# Patient Record
Sex: Male | Born: 1960 | Race: White | Hispanic: No | Smoking: Current every day smoker
Health system: Southern US, Community
[De-identification: ages and names within clinical notes are randomized; demographics above are authoritative.]

## PROBLEM LIST (undated history)

## (undated) DIAGNOSIS — L309 Dermatitis, unspecified: Secondary | ICD-10-CM

## (undated) DIAGNOSIS — F329 Major depressive disorder, single episode, unspecified: Secondary | ICD-10-CM

## (undated) DIAGNOSIS — I10 Essential (primary) hypertension: Secondary | ICD-10-CM

## (undated) DIAGNOSIS — M810 Age-related osteoporosis without current pathological fracture: Secondary | ICD-10-CM

## (undated) DIAGNOSIS — F419 Anxiety disorder, unspecified: Secondary | ICD-10-CM

## (undated) DIAGNOSIS — F32A Depression, unspecified: Secondary | ICD-10-CM

## (undated) DIAGNOSIS — K219 Gastro-esophageal reflux disease without esophagitis: Secondary | ICD-10-CM

## (undated) DIAGNOSIS — F319 Bipolar disorder, unspecified: Secondary | ICD-10-CM

## (undated) DIAGNOSIS — M1990 Unspecified osteoarthritis, unspecified site: Secondary | ICD-10-CM

## (undated) DIAGNOSIS — M2550 Pain in unspecified joint: Secondary | ICD-10-CM

## (undated) DIAGNOSIS — R51 Headache: Secondary | ICD-10-CM

## (undated) HISTORY — PX: TONSILLECTOMY: SUR1361

## (undated) HISTORY — PX: SEPTOPLASTY: SUR1290

## (undated) HISTORY — PX: OTHER SURGICAL HISTORY: SHX169

---

## 2001-07-16 ENCOUNTER — Encounter: Payer: Self-pay | Admitting: Emergency Medicine

## 2001-07-16 ENCOUNTER — Inpatient Hospital Stay (HOSPITAL_COMMUNITY): Admission: EM | Admit: 2001-07-16 | Discharge: 2001-07-17 | Payer: Self-pay

## 2001-12-16 ENCOUNTER — Emergency Department (HOSPITAL_COMMUNITY): Admission: EM | Admit: 2001-12-16 | Discharge: 2001-12-16 | Payer: Self-pay | Admitting: Emergency Medicine

## 2002-01-06 ENCOUNTER — Ambulatory Visit (HOSPITAL_COMMUNITY): Admission: RE | Admit: 2002-01-06 | Discharge: 2002-01-06 | Payer: Self-pay | Admitting: Family Medicine

## 2002-01-06 ENCOUNTER — Encounter: Payer: Self-pay | Admitting: Family Medicine

## 2002-04-05 ENCOUNTER — Encounter: Payer: Self-pay | Admitting: *Deleted

## 2002-04-05 ENCOUNTER — Encounter: Admission: RE | Admit: 2002-04-05 | Discharge: 2002-04-05 | Payer: Self-pay | Admitting: *Deleted

## 2002-05-22 ENCOUNTER — Encounter: Admission: RE | Admit: 2002-05-22 | Discharge: 2002-08-20 | Payer: Self-pay | Admitting: Family Medicine

## 2003-09-04 ENCOUNTER — Inpatient Hospital Stay (HOSPITAL_COMMUNITY): Admission: EM | Admit: 2003-09-04 | Discharge: 2003-09-06 | Payer: Self-pay | Admitting: Emergency Medicine

## 2004-03-24 ENCOUNTER — Ambulatory Visit: Payer: Self-pay | Admitting: Family Medicine

## 2004-03-31 ENCOUNTER — Ambulatory Visit (HOSPITAL_COMMUNITY): Admission: RE | Admit: 2004-03-31 | Discharge: 2004-03-31 | Payer: Self-pay | Admitting: Family Medicine

## 2004-04-11 ENCOUNTER — Ambulatory Visit: Payer: Self-pay | Admitting: Nurse Practitioner

## 2004-06-23 ENCOUNTER — Ambulatory Visit: Payer: Self-pay | Admitting: Family Medicine

## 2004-06-30 ENCOUNTER — Ambulatory Visit: Payer: Self-pay | Admitting: Family Medicine

## 2005-01-27 ENCOUNTER — Ambulatory Visit: Payer: Self-pay | Admitting: Family Medicine

## 2005-03-09 ENCOUNTER — Ambulatory Visit: Payer: Self-pay | Admitting: Family Medicine

## 2005-05-29 ENCOUNTER — Ambulatory Visit: Payer: Self-pay | Admitting: Family Medicine

## 2005-09-29 ENCOUNTER — Ambulatory Visit: Payer: Self-pay | Admitting: Family Medicine

## 2005-12-31 ENCOUNTER — Ambulatory Visit: Payer: Self-pay | Admitting: Internal Medicine

## 2006-02-17 ENCOUNTER — Ambulatory Visit: Payer: Self-pay | Admitting: Family Medicine

## 2006-02-25 ENCOUNTER — Ambulatory Visit: Payer: Self-pay | Admitting: Family Medicine

## 2006-03-01 ENCOUNTER — Ambulatory Visit (HOSPITAL_COMMUNITY): Admission: RE | Admit: 2006-03-01 | Discharge: 2006-03-01 | Payer: Self-pay | Admitting: Family Medicine

## 2006-04-05 ENCOUNTER — Ambulatory Visit: Payer: Self-pay | Admitting: Family Medicine

## 2006-04-07 ENCOUNTER — Ambulatory Visit: Payer: Self-pay | Admitting: Family Medicine

## 2006-04-12 ENCOUNTER — Ambulatory Visit (HOSPITAL_BASED_OUTPATIENT_CLINIC_OR_DEPARTMENT_OTHER): Admission: RE | Admit: 2006-04-12 | Discharge: 2006-04-12 | Payer: Self-pay | Admitting: Otolaryngology

## 2006-07-22 ENCOUNTER — Ambulatory Visit: Payer: Self-pay | Admitting: Family Medicine

## 2007-04-25 ENCOUNTER — Ambulatory Visit: Payer: Self-pay | Admitting: Family Medicine

## 2007-04-25 LAB — CONVERTED CEMR LAB
ALT: 20 units/L (ref 0–53)
AST: 18 units/L (ref 0–37)
Alkaline Phosphatase: 73 units/L (ref 39–117)
Calcium: 9.5 mg/dL (ref 8.4–10.5)
Chloride: 101 meq/L (ref 96–112)
Creatinine, Ser: 0.9 mg/dL (ref 0.40–1.50)
LDL Cholesterol: 94 mg/dL (ref 0–99)
Potassium: 4.4 meq/L (ref 3.5–5.3)
Total CHOL/HDL Ratio: 2.5
Valproic Acid Lvl: 22.3 ug/mL — ABNORMAL LOW (ref 50.0–100.0)

## 2007-10-24 ENCOUNTER — Ambulatory Visit: Payer: Self-pay | Admitting: Family Medicine

## 2007-10-24 LAB — CONVERTED CEMR LAB
Cholesterol: 215 mg/dL — ABNORMAL HIGH (ref 0–200)
PSA: 0.67 ng/mL (ref 0.10–4.00)
Testosterone: 256.13 ng/dL — ABNORMAL LOW (ref 350–890)
Total CHOL/HDL Ratio: 4.3

## 2007-11-15 ENCOUNTER — Ambulatory Visit: Payer: Self-pay | Admitting: Internal Medicine

## 2007-11-22 ENCOUNTER — Ambulatory Visit: Payer: Self-pay | Admitting: Internal Medicine

## 2007-12-22 ENCOUNTER — Ambulatory Visit: Payer: Self-pay | Admitting: Internal Medicine

## 2008-01-12 ENCOUNTER — Ambulatory Visit: Payer: Self-pay | Admitting: Internal Medicine

## 2008-01-13 ENCOUNTER — Ambulatory Visit (HOSPITAL_COMMUNITY): Admission: RE | Admit: 2008-01-13 | Discharge: 2008-01-13 | Payer: Self-pay | Admitting: Internal Medicine

## 2008-01-16 ENCOUNTER — Ambulatory Visit: Payer: Self-pay | Admitting: Internal Medicine

## 2008-01-27 ENCOUNTER — Ambulatory Visit: Payer: Self-pay | Admitting: Internal Medicine

## 2008-03-13 ENCOUNTER — Ambulatory Visit: Payer: Self-pay | Admitting: Family Medicine

## 2008-03-13 LAB — CONVERTED CEMR LAB
CO2: 23 meq/L (ref 19–32)
Calcium: 9.4 mg/dL (ref 8.4–10.5)
Cholesterol: 309 mg/dL — ABNORMAL HIGH (ref 0–200)
Creatinine, Ser: 1.01 mg/dL (ref 0.40–1.50)
HDL: 80 mg/dL (ref >39)
Sed Rate: 2 mm/hr (ref 0–16)
Sodium: 137 meq/L (ref 135–145)
Total CHOL/HDL Ratio: 3.9
Triglycerides: 295 mg/dL — ABNORMAL HIGH (ref <150)
Vit D, 1,25-Dihydroxy: 38 (ref 30–89)

## 2008-03-22 ENCOUNTER — Ambulatory Visit (HOSPITAL_COMMUNITY): Admission: RE | Admit: 2008-03-22 | Discharge: 2008-03-22 | Payer: Self-pay | Admitting: Family Medicine

## 2008-04-17 ENCOUNTER — Ambulatory Visit: Payer: Self-pay | Admitting: Internal Medicine

## 2008-05-30 ENCOUNTER — Ambulatory Visit: Payer: Self-pay | Admitting: Internal Medicine

## 2008-07-19 ENCOUNTER — Ambulatory Visit: Payer: Self-pay | Admitting: Family Medicine

## 2008-08-07 ENCOUNTER — Ambulatory Visit: Payer: Self-pay | Admitting: Vascular Surgery

## 2008-08-07 ENCOUNTER — Encounter (INDEPENDENT_AMBULATORY_CARE_PROVIDER_SITE_OTHER): Payer: Self-pay | Admitting: Family Medicine

## 2008-08-07 ENCOUNTER — Ambulatory Visit (HOSPITAL_COMMUNITY): Admission: RE | Admit: 2008-08-07 | Discharge: 2008-08-07 | Payer: Self-pay | Admitting: Family Medicine

## 2008-08-20 ENCOUNTER — Ambulatory Visit: Payer: Self-pay | Admitting: Family Medicine

## 2008-09-20 ENCOUNTER — Ambulatory Visit: Payer: Self-pay | Admitting: Family Medicine

## 2008-09-20 LAB — CONVERTED CEMR LAB
Alkaline Phosphatase: 82 units/L (ref 39–117)
BUN: 12 mg/dL (ref 6–23)
CO2: 25 meq/L (ref 19–32)
Cholesterol: 253 mg/dL — ABNORMAL HIGH (ref 0–200)
Creatinine, Ser: 1 mg/dL (ref 0.40–1.50)
Glucose, Bld: 83 mg/dL (ref 70–99)
HDL: 56 mg/dL (ref >39)
Total Bilirubin: 0.7 mg/dL (ref 0.3–1.2)
Triglycerides: 423 mg/dL — ABNORMAL HIGH (ref <150)

## 2008-10-17 ENCOUNTER — Ambulatory Visit: Payer: Self-pay | Admitting: Internal Medicine

## 2008-10-18 ENCOUNTER — Ambulatory Visit: Payer: Self-pay | Admitting: Family Medicine

## 2009-01-02 ENCOUNTER — Ambulatory Visit: Payer: Self-pay | Admitting: Family Medicine

## 2009-01-02 LAB — CONVERTED CEMR LAB
Cholesterol: 231 mg/dL — ABNORMAL HIGH (ref 0–200)
LDL Cholesterol: 135 mg/dL — ABNORMAL HIGH (ref 0–99)
Total CHOL/HDL Ratio: 3.6
Triglycerides: 155 mg/dL — ABNORMAL HIGH (ref <150)
VLDL: 31 mg/dL (ref 0–40)

## 2009-01-22 ENCOUNTER — Ambulatory Visit: Payer: Self-pay | Admitting: Family Medicine

## 2009-01-22 LAB — CONVERTED CEMR LAB
Basophils Absolute: 0 10*3/uL (ref 0.0–0.1)
Eosinophils Absolute: 0.2 10*3/uL (ref 0.0–0.7)
Eosinophils Relative: 2 % (ref 0–5)
HCT: 45.4 % (ref 39.0–52.0)
Lymphs Abs: 3.7 10*3/uL (ref 0.7–4.0)
MCV: 100.2 fL — ABNORMAL HIGH (ref 78.0–100.0)
Neutrophils Relative %: 52 % (ref 43–77)
Platelets: 253 10*3/uL (ref 150–400)
RDW: 13.3 % (ref 11.5–15.5)
Vit D, 25-Hydroxy: 55 ng/mL (ref 30–89)
WBC: 10.6 10*3/uL — ABNORMAL HIGH (ref 4.0–10.5)

## 2009-02-06 ENCOUNTER — Ambulatory Visit: Payer: Self-pay | Admitting: Family Medicine

## 2009-03-18 ENCOUNTER — Ambulatory Visit: Payer: Self-pay | Admitting: Internal Medicine

## 2009-04-17 ENCOUNTER — Ambulatory Visit: Payer: Self-pay | Admitting: Internal Medicine

## 2009-05-21 ENCOUNTER — Ambulatory Visit: Payer: Self-pay | Admitting: Internal Medicine

## 2009-05-29 ENCOUNTER — Encounter (INDEPENDENT_AMBULATORY_CARE_PROVIDER_SITE_OTHER): Payer: Self-pay | Admitting: Family Medicine

## 2009-05-29 ENCOUNTER — Ambulatory Visit: Payer: Self-pay | Admitting: Internal Medicine

## 2009-05-29 LAB — CONVERTED CEMR LAB
Cholesterol: 209 mg/dL — ABNORMAL HIGH (ref 0–200)
LDL Cholesterol: 115 mg/dL — ABNORMAL HIGH (ref 0–99)
PSA: 0.83 ng/mL (ref 0.10–4.00)
Total CHOL/HDL Ratio: 3.3
VLDL: 31 mg/dL (ref 0–40)

## 2009-06-18 ENCOUNTER — Ambulatory Visit: Payer: Self-pay | Admitting: Family Medicine

## 2009-07-18 ENCOUNTER — Ambulatory Visit: Payer: Self-pay | Admitting: Family Medicine

## 2009-08-15 ENCOUNTER — Ambulatory Visit: Payer: Self-pay | Admitting: Internal Medicine

## 2009-08-16 ENCOUNTER — Ambulatory Visit: Payer: Self-pay | Admitting: Family Medicine

## 2009-09-12 ENCOUNTER — Ambulatory Visit: Payer: Self-pay | Admitting: Internal Medicine

## 2009-11-15 ENCOUNTER — Ambulatory Visit: Payer: Self-pay | Admitting: Family Medicine

## 2009-11-19 ENCOUNTER — Ambulatory Visit (HOSPITAL_COMMUNITY): Admission: RE | Admit: 2009-11-19 | Discharge: 2009-11-19 | Payer: Self-pay | Admitting: Family Medicine

## 2010-04-01 ENCOUNTER — Encounter (INDEPENDENT_AMBULATORY_CARE_PROVIDER_SITE_OTHER): Payer: Self-pay | Admitting: Family Medicine

## 2010-04-01 LAB — CONVERTED CEMR LAB
ALT: 12 units/L (ref 0–53)
AST: 28 units/L (ref 0–37)
Albumin: 4.1 g/dL (ref 3.5–5.2)
Alkaline Phosphatase: 91 units/L (ref 39–117)
LDL Cholesterol: 85 mg/dL (ref 0–99)
Potassium: 4.4 meq/L (ref 3.5–5.3)
Sodium: 140 meq/L (ref 135–145)
Total Bilirubin: 0.6 mg/dL (ref 0.3–1.2)
Total Protein: 6.6 g/dL (ref 6.0–8.3)
Triglycerides: 328 mg/dL — ABNORMAL HIGH (ref <150)
VLDL: 66 mg/dL — ABNORMAL HIGH (ref 0–40)

## 2010-11-07 NOTE — Op Note (Signed)
Daniel Harrell, Daniel Harrell             ACCOUNT NO.:  [id number]   MEDICAL RECORD NO.:  [id number]          PATIENT TYPE:  AMB   LOCATION:  DSC                          FACILITY:  [facility]   PHYSICIAN:  Karol T. Lazarus Salines, M.D. DATE OF BIRTH:  [birth year]   DATE OF PROCEDURE:  04/12/2006  DATE OF DISCHARGE:                                 OPERATIVE REPORT   PREOPERATIVE DIAGNOSIS:  Nasal septal deviation.  Hypertrophic inferior  turbinates.  Concha bullosa, left middle turbinate.   POSTOPERATIVE DIAGNOSIS:  Nasal septal deviation.  Hypertrophic inferior  turbinates.  Concha bullosa, left middle turbinate.   PROCEDURE PERFORMED:  1. Nasal septoplasty.  2. Bilateral submucous resection of inferior turbinates.  3. Partial excision left middle turbinate (conchal cell).   SURGEON:  Gloris Manchester. Lazarus Salines, M.D.   ANESTHESIA:  General orotracheal.   BLOOD LOSS:  Minimal.   COMPLICATIONS:  None.   FINDINGS:  An overall narrow nose with a rightward septal deviation and  bulky inferior turbinates and a bulky the left middle turbinate.   PROCEDURE:  With the patient in the comfortable supine position, general  orotracheal anesthesia was induced without difficulty.  At an appropriate  level, the patient was placed in a slight sitting position.  A saline  moistened throat pack was placed.  Nasal vibrissae were trimmed.  Cocaine  crystals, 200 mg total were applied on cotton carriers to the anterior  ethmoid and sphenopalatine ganglion regions on both sides.  Cocaine  solution, 160 mg total were applied on one-half x three cotton pledgets  against both sides of the septal mucosa.  1% Xylocaine with 1:100,000  epinephrine, 9 mL total infiltrated into the submucoperichondrial plane of  the septum on both sides, into the nasal spine region, into the membranous  columella, into the anterior floor of the nose on both sides.  Several  minutes were allowed for hemostasis and anesthesia to take effect.  A  sterile preparation and draping of the midface was accomplished.   The materials were removed from the nose and observed to be intact and  correct in number.  The findings were as described above.  A left-sided  hemitransfixion incision was sharply executed and carried down to the free  edge of the quadrangular cartilage and continued down to a floor incision.  The floor tunnel was elevated and brought medially to the vomer and forward  along the maxillary crest.  The submucoperichondrial plane of the septum was  elevated.  There was a sticky area on the low anterior septum and a small  perforation of the septum was generated on this one side.  The dissection  was carried up to the perpendicular plate of ethmoid downward and  communicated with the floor tunnel and brought forward along the maxillary  crest.   The chondroethmoid junction was identified and opened with the Cottle  elevator and the opposite submucoperiosteal plane of the septum was elevated  up to the dorsum of the nose and down to the floor.  The superior  perpendicular plate was lysed with an open Jansen-Middleton forceps to  prevent rocking in the  region of the cribriform plate.  Dissection was  carried further inferiorly and a large piece of cartilage was isolated and  then delivered with a closed Jansen-Middleton forceps.  Bony spicules along  the vomer and the posterior septum including a tail of the quadrangular  cartilage were submucosally resected and discarded.  At this point a 2 mL  strut along the inferior edge of quadrangular cartilage where it was fairly  heavily spurred was submucosally resected.  This allowed the septum to  trapdoor into the midline more freely.  The maxillary crest posterior to  this point was reduced with a osteotome and mallet and submucosally  delivered.  At this point the septum was straight and in the midline with  improvement in airway on both sides.  It was secured to the nasal spine  with  a figure-of-eight 4-0 PDS suture.  The tunnels were carefully suctioned and  the incision was closed with interrupted 4-0 chromic suture.   Just prior to completing the septoplasty, the inferior turbinates were  infiltrated with 6 mL total, 1% Xylocaine with 1:100,000 epinephrine using a  25 gauge spinal needle.  Two additional mL were applied into the left middle  turbinate.  Several additional minutes were allowed for hemostasis to take  effect.   Free edge of the middle turbinate was sharply incised and carried down to  the bone.  I could not enter the conchal cell with the knife.  Therefore,  the mucosa on both sides of the bulbous anterior pole of middle turbinate  was resected and then the anterior pole breaking into the conchal cell was  removed.  The bone was removed but not the mucosa.  Hemostasis was  spontaneous.   Beginning on the right side, the anterior hood of the inferior turbinate was  lysed just behind the nasal valve and the medial mucosa was incised in an  anterior upsloping fashion.  A laterally based flap was developed.  The  turbinate was infractured.  Using angled turbinate scissors, the turbinate  bone and lateral mucosa were resected in the posterior downsloping fashion  taking virtually all the anterior pole and leaving virtually all the  posterior pole.  Bony spicules were carefully removed to allow more prompt  healing.  The flap was laid back down the turbinate was outfractured.  The  left side was done in identical fashion.  After completing both turbinates,  the bulbous posterior pole and the cut mucosal edges were suction coagulated  for hemostasis.  Hemostasis was observed throughout the nose.   At this point, 0.040 reinforced Silastic splints were shaped and placed  against the septum on both sides and secured thereto with a 3-0 Ethilon  stitch.   A double thickness Telfa pack impregnated with bacitracin ointment was placed along the inferior  turbinate on each side for hemostasis.   A 6.5-mm nasal trumpet, shortened to reach into the nasopharynx, was placed  in each of the nose between the Silastic splint and the Telfa pack to allow  hemostasis but also to allow some airway out.  Again hemostasis was  observed.  At this point the pharynx was suctioned clean and the throat pack  was removed.  The patient was returned to Anesthesia, awakened, extubated,  and transferred to recovery in stable condition.   COMMENT:  A 50 year old white male with a long history of Afrin abuse with a  narrow nose with deviated septum and large turbinates was indication for  today's procedure.  Anticipate  routine postoperative recovery with attention  to ice, elevation, analgesia, antibiosis.  Will remove the nasal packs  tomorrow and the septal splints in 10 days.  Given low anticipated risk of  postanesthetic or postsurgical complications, I feel outpatient venue is  appropriate.      Gloris Manchester. Lazarus Salines, M.D.  Electronically Signed     KTW/MEDQ  D:  04/12/2006  T:  04/12/2006  Job:  [id number]   cc:   Maurice March, M.D.

## 2010-11-07 NOTE — H&P (Signed)
NAME:  Daniel Harrell, Daniel Harrell                       ACCOUNT NO.:  [id number]   MEDICAL RECORD NO.:  [id number]                   PATIENT TYPE:  EMS   LOCATION:  MAJO                                 FACILITY:  [facility]   PHYSICIAN:  Corinna L. Lendell Caprice, MD             DATE OF BIRTH:  [birth year]   DATE OF ADMISSION:  09/03/2003  DATE OF DISCHARGE:                                HISTORY & PHYSICAL   CHIEF COMPLAINT:  Overdose.   HISTORY OF PRESENT ILLNESS:  Daniel Harrell is a 50 year old white male who  called 911 about an hour after he took about 20 tablets of verapamil,  Trileptal, Flexeril, and Trazodone tonight.  He brought the pills with him,  and the medication bottles each are about half full.  He had also been  drinking several beers and smoking marijuana.  He has attempted suicide in  the past.  He has been very depressed about not being able to get disability  for his chronic headaches.  He reports that he still wants to die.   PAST MEDICAL HISTORY:  1. Bipolar disorder.  2. Cluster headaches.  3. Hypertension.  4. He gets his primary care at [facility].   MEDICATIONS:  1. Trazodone 100 mg 1-/12 tablets p.o. q.h.s.  2. Flexeril 1 tablet 3 times a day or 1 tablet in the morning and 2 tablets     in the evening.  3. Trileptal 300 mg 1 tablet every morning, 3 tablets every night.  4. Verapamil 120 mg p.o. four times a day.   SOCIAL HISTORY:  The patient smokes a little less than a pack of cigarettes  a day.  He drinks beer frequently but not daily.  He is not more specific  than this.  He smokes marijuana occasionally.  He has not worked for two  years and is apparently trying to get disability for cluster headaches and  bipolar disorder.   FAMILY HISTORY:  Noncontributory.   REVIEW OF SYSTEMS:  As above, otherwise negative.   PHYSICAL EXAMINATION:  VITAL SIGNS:  Temperature 97 degrees, heart rate 90,  respiratory rate 18, blood pressure 112/71, pulse oximetry 95%.  GENERAL:  The patient is a somnolent white male in no acute distress.  HEENT:  Normocephalic and atraumatic.  Pupils equal, round, and reactive to  light.  Sclerae nonicteric.  Moist mucous membranes.  NECK:  Supple, no lymphadenopathy.  LUNGS:  Clear to auscultation bilaterally without wheeze, rhonchi, or rales.  CARDIOVASCULAR:  Regular rate and rhythm without murmurs, rubs, or gallops.  ABDOMEN:  Normal bowel sounds, soft nontender, nondistended.  GU/RECTAL:  Deferred.  EXTREMITIES:  No clubbing, cyanosis, or edema.  PSYCHIATRIC:  The patient is cooperative, has flat affect, poor eye contact.  He is quite talkative, however.  NEUROLOGIC:  Somnolent but arousable.  Sensory and motor exam grossly  intact.   LABORATORY AND X-RAY DATA:  CBC is normal.  Complete metabolic panel  is  significant for potassium of 3.4, alkaline phosphatase 149, otherwise  normal.  Acetaminophen level less than 10. Salicylate level less than 4.  Urine drug screen positive for THC and tricyclic antidepressant.  Alcohol  level is 94.   EMERGENCY ROOM COURSE:  The patient took charcoal.  The ED physician spoke  with poison control several times, and they recommended charcoal and  monitoring overnight.   IMPRESSION AND PLAN:  1. Polysubstance overdose, specifically verapamil, Trazodone, Trileptal,     Cyclobenzaprine.  I will monitor the patient on telemetry, check and EKG     and then consult Dr. Jeanie Sewer in the morning.  He should be medically     cleared by then. All of his other medications, of course, will be held.                                                Corinna L. Lendell Caprice, MD    CLS/MEDQ  D:  09/04/2003  T:  09/04/2003  Job:  [id number]

## 2010-11-07 NOTE — Discharge Summary (Signed)
NAME:  Daniel, Harrell                       ACCOUNT NO.:  [id number]   MEDICAL RECORD NO.:  [id number]                   PATIENT TYPE:  INP   LOCATION:  3023                                 FACILITY:  [facility]   PHYSICIAN:  Sherin Quarry, MD                   DATE OF BIRTH:  Sep 07, 1960   DATE OF ADMISSION:  09/03/2003  DATE OF DISCHARGE:  09/06/2003                                 DISCHARGE SUMMARY   HISTORY:  Daniel Harrell is a 50 year old man who was initially admitted on  September 04, 2003, by Dr. Cammie Mcgee L. Lendell Caprice.  At that time, he reported that  he had called 911 prior to coming to the emergency room after taking twenty  tablets of Verapamil, Trileptal, Flexeril, and Trazodone.  In addition, he  had been drinking heavily.  He also had been smoking marijuana.  He reported  that he had made suicide attempts in the past.  He told Dr. Lendell Caprice that he  was very depressed about not being able to get disability for his chronic  headaches.  He reported that he wanted to die at the time of this admission.   PHYSICAL EXAMINATION:  VITAL SIGNS:  His physical exam revealed a  temperature of 97, heart rate of 90, respiratory rate of 18, blood pressure  112/71.  GENERAL:  The patient was somewhat somnolent.  He was in no acute distress.  HEENT:  Exam within normal limits.  CHEST:  Clear to auscultation and percussion.  BACK:  Joints in the back revealed no CVA or flank tenderness.  CARDIOVASCULAR:  Exam revealed normal S1, S2.  No murmurs, rubs or gallops.  ABDOMEN:  Benign with normal bowel sounds.  No masses, tenderness or  organomegaly.  NEUROLOGIC TESTING:  The patient was somnolent, but arousable.  His sensory  and motor exams were described as grossly intact.  PSYCHIATRIC EXAM:  On psychiatric exam he was cooperative and had flat  affect with poor eye contact.  He was quite talkative and somewhat  tangential in his speech.   Dr. Lendell Caprice performed laboratory studies.  These  included a blood alcohol  level of 94, his sodium was 130, potassium 3.4, CBC was within normal  limits.  The acetaminophen and salicylates levels were normal.  The urine  drug screen was positive for THC.  Tricyclic level was also positive.   On admission the patient was started on oral thiamine.  He was given Pepcid  20 mg b.i.d.  In light of slightly low potassium levels he was placed on KCL  supplementation.  The next day the patient was back to baseline mental  status.  He was therefore placed back on Trileptal 300 mg in the morning and  600 mg in the evening, Trazodone 100 mg at bedtime, and Flexeril 10 mg  t.i.d. p.r.n., which he takes for back spasms.  Dr. Jeanie Sewer of the  psychiatry service was  consulted.  Dr. Jeanie Sewer evaluated the patient  carefully and felt that he had bipolar disorder, as well as a narcissistic  personality disorder.  He also had a substance abuse pattern typical of  bipolar disorder.  He felt that the patient did not require suicide  precautions and that he was not a suicide risk.  He felt the patient did not  require inpatient psychiatric care.  He suggested to the patient that ADS  would be appropriate, but the patient did not want to consider this.  I  discussed the patient's case with Dr. Jeanie Sewer again on March 17th, and we  agreed together that the best approach would be to go ahead and discharge  him.  Therefore, on September 06, 2003, the patient was discharged.   DISCHARGE DIAGNOSES:  1. Suicide attempt.  2. Bipolar disorder.  3. Personality disorder.  4. Cluster headaches.  5. Hypertension.   The patient was advised to continue his usual medications at discharge.  Condition at time of discharge was good.                                                Sherin Quarry, MD    SY/MEDQ  D:  09/06/2003  T:  09/08/2003  Job:  [id number]   cc:   [facility]

## 2011-09-21 ENCOUNTER — Other Ambulatory Visit (HOSPITAL_COMMUNITY): Payer: Self-pay | Admitting: Family Medicine

## 2011-09-21 DIAGNOSIS — IMO0002 Reserved for concepts with insufficient information to code with codable children: Secondary | ICD-10-CM

## 2011-09-29 ENCOUNTER — Ambulatory Visit (HOSPITAL_COMMUNITY)
Admission: RE | Admit: 2011-09-29 | Discharge: 2011-09-29 | Disposition: A | Discharge status: Home / Self Care | Payer: Medicare Other | Source: Ambulatory Visit | Attending: Family Medicine | Admitting: Family Medicine

## 2011-09-29 DIAGNOSIS — IMO0002 Reserved for concepts with insufficient information to code with codable children: Secondary | ICD-10-CM | POA: Insufficient documentation

## 2011-09-29 DIAGNOSIS — Z13820 Encounter for screening for osteoporosis: Secondary | ICD-10-CM | POA: Insufficient documentation

## 2012-03-24 ENCOUNTER — Other Ambulatory Visit: Payer: Self-pay | Admitting: Dermatology

## 2012-10-12 ENCOUNTER — Other Ambulatory Visit: Payer: Self-pay | Admitting: Gastroenterology

## 2013-04-18 ENCOUNTER — Other Ambulatory Visit: Payer: Self-pay | Admitting: Dermatology

## 2013-07-25 ENCOUNTER — Other Ambulatory Visit: Payer: Self-pay | Admitting: Dermatology

## 2013-11-15 ENCOUNTER — Encounter (HOSPITAL_COMMUNITY): Payer: Self-pay | Admitting: Pharmacy Technician

## 2013-11-24 ENCOUNTER — Encounter (HOSPITAL_COMMUNITY): Payer: Self-pay | Admitting: *Deleted

## 2013-12-06 ENCOUNTER — Other Ambulatory Visit: Payer: Self-pay | Admitting: Gastroenterology

## 2013-12-07 ENCOUNTER — Ambulatory Visit (HOSPITAL_COMMUNITY)
Admission: RE | Admit: 2013-12-07 | Discharge: 2013-12-07 | Disposition: A | Discharge status: Home / Self Care | Payer: Medicare Other | Source: Ambulatory Visit | Attending: Gastroenterology | Admitting: Gastroenterology

## 2013-12-07 ENCOUNTER — Ambulatory Visit (HOSPITAL_COMMUNITY): Payer: Medicare Other | Admitting: Anesthesiology

## 2013-12-07 ENCOUNTER — Encounter (HOSPITAL_COMMUNITY): Payer: Self-pay

## 2013-12-07 ENCOUNTER — Encounter (HOSPITAL_COMMUNITY)
Admission: RE | Disposition: A | Discharge status: Home / Self Care | Payer: Self-pay | Source: Ambulatory Visit | Attending: Gastroenterology

## 2013-12-07 ENCOUNTER — Encounter (HOSPITAL_COMMUNITY): Payer: Medicare Other | Admitting: Anesthesiology

## 2013-12-07 DIAGNOSIS — F17200 Nicotine dependence, unspecified, uncomplicated: Secondary | ICD-10-CM | POA: Insufficient documentation

## 2013-12-07 DIAGNOSIS — Z1211 Encounter for screening for malignant neoplasm of colon: Secondary | ICD-10-CM | POA: Insufficient documentation

## 2013-12-07 DIAGNOSIS — F411 Generalized anxiety disorder: Secondary | ICD-10-CM | POA: Insufficient documentation

## 2013-12-07 DIAGNOSIS — D126 Benign neoplasm of colon, unspecified: Secondary | ICD-10-CM | POA: Insufficient documentation

## 2013-12-07 DIAGNOSIS — K648 Other hemorrhoids: Secondary | ICD-10-CM | POA: Insufficient documentation

## 2013-12-07 DIAGNOSIS — Z860100 Personal history of colon polyps, unspecified: Secondary | ICD-10-CM | POA: Insufficient documentation

## 2013-12-07 DIAGNOSIS — I10 Essential (primary) hypertension: Secondary | ICD-10-CM | POA: Insufficient documentation

## 2013-12-07 DIAGNOSIS — K219 Gastro-esophageal reflux disease without esophagitis: Secondary | ICD-10-CM | POA: Insufficient documentation

## 2013-12-07 DIAGNOSIS — F319 Bipolar disorder, unspecified: Secondary | ICD-10-CM | POA: Insufficient documentation

## 2013-12-07 HISTORY — DX: Essential (primary) hypertension: I10

## 2013-12-07 HISTORY — DX: Headache: R51

## 2013-12-07 HISTORY — PX: COLONOSCOPY WITH PROPOFOL: SHX5780

## 2013-12-07 HISTORY — DX: Gastro-esophageal reflux disease without esophagitis: K21.9

## 2013-12-07 HISTORY — DX: Age-related osteoporosis without current pathological fracture: M81.0

## 2013-12-07 HISTORY — DX: Dermatitis, unspecified: L30.9

## 2013-12-07 HISTORY — DX: Pain in unspecified joint: M25.50

## 2013-12-07 HISTORY — PX: HOT HEMOSTASIS: SHX5433

## 2013-12-07 HISTORY — DX: Depression, unspecified: F32.A

## 2013-12-07 HISTORY — DX: Unspecified osteoarthritis, unspecified site: M19.90

## 2013-12-07 HISTORY — DX: Major depressive disorder, single episode, unspecified: F32.9

## 2013-12-07 HISTORY — DX: Anxiety disorder, unspecified: F41.9

## 2013-12-07 HISTORY — DX: Bipolar disorder, unspecified: F31.9

## 2013-12-07 SURGERY — COLONOSCOPY WITH PROPOFOL
Anesthesia: Monitor Anesthesia Care

## 2013-12-07 MED ORDER — SODIUM CHLORIDE 0.9 % IV SOLN: INTRAVENOUS | Status: DC

## 2013-12-07 MED ORDER — PROPOFOL 10 MG/ML IV BOLUS
INTRAVENOUS | Status: AC
  Filled 2013-12-07: qty 20

## 2013-12-07 MED ORDER — FENTANYL CITRATE 0.05 MG/ML IJ SOLN
INTRAMUSCULAR | Status: DC | PRN
  Administered 2013-12-07: 12.5 ug via INTRAVENOUS
  Administered 2013-12-07: 25 ug via INTRAVENOUS

## 2013-12-07 MED ORDER — PROPOFOL INFUSION 10 MG/ML OPTIME
INTRAVENOUS | Status: DC | PRN
  Administered 2013-12-07: 300 ug/kg/min via INTRAVENOUS

## 2013-12-07 MED ORDER — ESMOLOL HCL 10 MG/ML IV SOLN
INTRAVENOUS | Status: DC | PRN
  Administered 2013-12-07 (×2): 5 mg via INTRAVENOUS

## 2013-12-07 MED ORDER — GLYCOPYRROLATE 0.2 MG/ML IJ SOLN
INTRAMUSCULAR | Status: DC | PRN
  Administered 2013-12-07 (×2): 0.1 mg via INTRAVENOUS

## 2013-12-07 MED ORDER — LIDOCAINE HCL (CARDIAC) 20 MG/ML IV SOLN
INTRAVENOUS | Status: AC
  Filled 2013-12-07: qty 5

## 2013-12-07 MED ORDER — GLYCOPYRROLATE 0.2 MG/ML IJ SOLN
INTRAMUSCULAR | Status: AC
  Filled 2013-12-07: qty 1

## 2013-12-07 MED ORDER — FENTANYL CITRATE 0.05 MG/ML IJ SOLN
INTRAMUSCULAR | Status: AC
  Filled 2013-12-07: qty 2

## 2013-12-07 MED ORDER — METOCLOPRAMIDE HCL 5 MG/ML IJ SOLN
INTRAMUSCULAR | Status: DC | PRN
  Administered 2013-12-07: 10 mg via INTRAVENOUS

## 2013-12-07 MED ORDER — METOCLOPRAMIDE HCL 5 MG/ML IJ SOLN
INTRAMUSCULAR | Status: AC
  Filled 2013-12-07: qty 2

## 2013-12-07 MED ORDER — LACTATED RINGERS IV SOLN
INTRAVENOUS | Status: DC
  Administered 2013-12-07: 1000 mL via INTRAVENOUS

## 2013-12-07 MED ORDER — LIDOCAINE HCL (CARDIAC) 20 MG/ML IV SOLN
INTRAVENOUS | Status: DC | PRN
  Administered 2013-12-07: 50 mg via INTRAVENOUS

## 2013-12-07 SURGICAL SUPPLY — 21 items

## 2013-12-07 NOTE — Anesthesia Postprocedure Evaluation (Signed)
Anesthesia Post Note  Patient: Daniel Harrell  Procedure(s) Performed: Procedure(s) (LRB): COLONOSCOPY WITH PROPOFOL (N/A) HOT HEMOSTASIS (ARGON PLASMA COAGULATION/BICAP) (N/A)  Anesthesia type: MAC  Patient location: PACU  Post pain: Pain level controlled  Post assessment: Post-op Vital signs reviewed  Last Vitals: BP 141/88  Pulse 66  Temp(Src) 36.8 C (Oral)  Resp 19  Ht 5\' 10"  (1.778 m)  Wt 185 lb (83.915 kg)  BMI 26.54 kg/m2  SpO2 100%  Post vital signs: Reviewed  Level of consciousness: awake  Complications: No apparent anesthesia complications

## 2013-12-07 NOTE — Addendum Note (Signed)
Addended by: Wilford Corner on: 12/07/2013 08:00 AM   Modules accepted: Orders

## 2013-12-07 NOTE — Transfer of Care (Signed)
Immediate Anesthesia Transfer of Care Note  Patient: Daniel Harrell  Procedure(s) Performed: Procedure(s): COLONOSCOPY WITH PROPOFOL (N/A) HOT HEMOSTASIS (ARGON PLASMA COAGULATION/BICAP) (N/A)  Patient Location: Endo Recovery  Anesthesia Type:MAC  Level of Consciousness: Patient easily awoken, sedated, comfortable, cooperative, following commands, responds to stimulation.   Airway & Oxygen Therapy: Patient spontaneously breathing, ventilating well, oxygen via simple oxygen mask.  Post-op Assessment: Report given to PACU RN, vital signs reviewed and stable, moving all extremities.   Post vital signs: Reviewed and stable.  Complications: No apparent anesthesia complications

## 2013-12-07 NOTE — Anesthesia Preprocedure Evaluation (Signed)
Anesthesia Evaluation  Patient identified by MRN, date of birth, ID band Patient awake    Reviewed: Allergy & Precautions, H&P , NPO status , Patient's Chart, lab work & pertinent test results  Airway Mallampati: II TM Distance: >3 FB Neck ROM: Full    Dental  (+) Dental Advisory Given   Pulmonary Current Smoker,  breath sounds clear to auscultation        Cardiovascular hypertension, Pt. on medications negative cardio ROS  Rhythm:Regular Rate:Normal     Neuro/Psych  Headaches, PSYCHIATRIC DISORDERS Anxiety Depression Bipolar Disorder    GI/Hepatic Neg liver ROS, GERD-  ,  Endo/Other  negative endocrine ROS  Renal/GU negative Renal ROS     Musculoskeletal negative musculoskeletal ROS (+)   Abdominal   Peds  Hematology negative hematology ROS (+)   Anesthesia Other Findings   Reproductive/Obstetrics negative OB ROS                           Anesthesia Physical Anesthesia Plan  ASA: II  Anesthesia Plan: MAC   Post-op Pain Management:    Induction: Intravenous  Airway Management Planned:   Additional Equipment:   Intra-op Plan:   Post-operative Plan:   Informed Consent: I have reviewed the patients History and Physical, chart, labs and discussed the procedure including the risks, benefits and alternatives for the proposed anesthesia with the patient or authorized representative who has indicated his/her understanding and acceptance.   Dental advisory given  Plan Discussed with: CRNA  Anesthesia Plan Comments:         Anesthesia Quick Evaluation

## 2013-12-07 NOTE — Interval H&P Note (Signed)
History and Physical Interval Note:  12/07/2013 8:26 AM  Daniel Harrell  has presented today for surgery, with the diagnosis of colon polyp  The various methods of treatment have been discussed with the patient and family. After consideration of risks, benefits and other options for treatment, the patient has consented to  Procedure(s): COLONOSCOPY WITH PROPOFOL (N/A) HOT HEMOSTASIS (ARGON PLASMA COAGULATION/BICAP) (N/A) as a surgical intervention .  The patient's history has been reviewed, patient examined, no change in status, stable for surgery.  I have reviewed the patient's chart and labs.  Questions were answered to the patient's satisfaction.     [place] C.

## 2013-12-07 NOTE — Discharge Instructions (Signed)
Hold aspirin and ibuprofen products for 2 weeks. Will call when pathology results are complete.

## 2013-12-07 NOTE — Op Note (Signed)
[facility] [place], [id number]   COLONOSCOPY PROCEDURE REPORT  PATIENT: Daniel Harrell, Daniel Harrell  MR#: [id number] BIRTHDATE: 05/24/1961 , 48  yrs. old GENDER: Male ENDOSCOPIST: Wilford Corner, MD REFERRED BY: PROCEDURE DATE:  12/07/2013 PROCEDURE:   Colonoscopy with snare polypectomy ASA CLASS:   Class II INDICATIONS:follow up of adenomatous colonic polyp(s). MEDICATIONS: See Anesthesia Report.  DESCRIPTION OF PROCEDURE:   After the risks benefits and alternatives of the procedure were thoroughly explained, informed consent was obtained.  The Pentax Ped Colon [id number]  endoscope was introduced through the anus and advanced to the cecum, which was identified by both the appendix and ileocecal valve , limited by No adverse events experienced.   limited by Limited by a tortuous colon. Excessive looping requiring abdominal pressure.   The quality of the prep was good. .  The instrument was then slowly withdrawn as the colon was fully examined.     FINDINGS:  Rectal exam unremarkable.  Pediatric colonoscope inserted into the colon and advanced to the cecum, where the appendiceal orifice and ileocecal valve were identified.   During insertion the previous tattooed area in the sigmoid colon was seen and a 1 cm polyp was noted on the opposite wall of the tattooed mucosa. On careful withdrawal of the colonoscope a 1.2 cm sessile polyp was seen in the splenic flexure and removed with snare cautery. In the descending colon an 8 mm sessile polyp was removed with snare cautery. At the area of the tattooed mucosa in the sigmoid colon 3 polyps were seen with sizes of 5 mm, 7 mm, and 1 cm and they were all removed with snare cautery.    Retroflexion revealed medium-sized internal hemorrhoids.  COMPLICATIONS: None  IMPRESSION:     Colon polyps X 5 - s/p snare cautery (see above) Internal hemorrhoids  RECOMMENDATIONS: F/U on path; No aspirin products for 2 weeks;  High fiber diet    ______________________________ eSignedWilford Corner, MD 12/07/2013 9:27 AM   CC:  PATIENT NAME:  [name], [name] MR#: [id number]

## 2013-12-07 NOTE — H&P (Signed)
  Date of Initial H&P: 11/30/13  History reviewed, patient examined, no change in status, stable for surgery. Surveillance colonoscopy due to history of colon polyps.

## 2013-12-08 ENCOUNTER — Encounter (HOSPITAL_COMMUNITY): Payer: Self-pay | Admitting: Gastroenterology

## 2014-10-23 ENCOUNTER — Other Ambulatory Visit: Payer: Self-pay | Admitting: Dermatology

## 2015-08-27 ENCOUNTER — Other Ambulatory Visit (HOSPITAL_COMMUNITY): Payer: Self-pay | Admitting: Psychiatry

## 2015-09-02 ENCOUNTER — Other Ambulatory Visit (HOSPITAL_COMMUNITY): Payer: Self-pay | Admitting: Psychiatry

## 2017-04-28 ENCOUNTER — Encounter: Payer: Medicare Other | Admitting: Vascular Surgery

## 2017-06-30 ENCOUNTER — Other Ambulatory Visit: Payer: Self-pay

## 2017-06-30 ENCOUNTER — Encounter: Payer: Self-pay | Admitting: Vascular Surgery

## 2017-06-30 ENCOUNTER — Ambulatory Visit: Payer: Medicare HMO | Admitting: Vascular Surgery

## 2017-06-30 VITALS — BP 132/79 | HR 78 | Temp 97.0°F | Resp 18 | Ht 70.0 in | Wt 192.0 lb

## 2017-06-30 DIAGNOSIS — I714 Abdominal aortic aneurysm, without rupture, unspecified: Secondary | ICD-10-CM

## 2017-06-30 NOTE — Progress Notes (Signed)
Referring Physician: Eldridge Abrahams, NP  Patient name: Daniel Harrell MRN: [id number] DOB: 1961-03-25 Sex: male  REASON FOR CONSULT: AAA  HPI: Daniel Harrell is a 57 y.o. male with an incidental finding of abdominal aortic aneurysm on recent CT scan evaluation for hematuria.  He denies any abdominal or back pain.  He has no significant family history of abdominal aortic aneurysm.  He does have a family history of potentially brain aneurysms.  I expressed to him today that these are not related.  Does have a history of hypertension and elevated cholesterol and is on medications to control these.  Is also on a daily aspirin.  He currently smokes about a quarter to half pack of cigarettes per day.  Greater than 3 minutes today spent regarding smoking cessation counseling.  Other medical problems include anxiety, bipolar disorder reflux all of which are currently controlled.  Past Medical History:  Diagnosis Date  . Anxiety   . Arthritis    in neck  . Bipolar disorder ([place])   . Depression   . Dermatitis    at beard and mustache line  . GERD (gastroesophageal reflux disease)   . Headache(784.0)    cluster ha since 1982  . Hypertension   . Joint pain   . Osteoporosis   . Osteoporosis    Past Surgical History:  Procedure Laterality Date  . COLONOSCOPY WITH PROPOFOL N/A 12/07/2013   Procedure: COLONOSCOPY WITH PROPOFOL;  Surgeon: Lear Ng, MD;  Location: [facility];  Service: Endoscopy;  Laterality: N/A;  . dupterens contracture Left   . hand reset Right age 42  . HOT HEMOSTASIS N/A 12/07/2013   Procedure: HOT HEMOSTASIS (ARGON PLASMA COAGULATION/BICAP);  Surgeon: Lear Ng, MD;  Location: [facility];  Service: Endoscopy;  Laterality: N/A;  . knot removed from back and neck  back 2014, neck 2015   benign  . SEPTOPLASTY    . TONSILLECTOMY  as child    No family history on file.  SOCIAL HISTORY: Social History   Socioeconomic History  . Marital status:  Divorced    Spouse name: Not on file  . Number of children: Not on file  . Years of education: Not on file  . Highest education level: Not on file  Social Needs  . Financial resource strain: Not on file  . Food insecurity - worry: Not on file  . Food insecurity - inability: Not on file  . Transportation needs - medical: Not on file  . Transportation needs - non-medical: Not on file  Occupational History  . Not on file  Tobacco Use  . Smoking status: Current Every Day Smoker    Packs/day: 0.25    Years: 36.00    Pack years: 9.00    Types: Cigarettes  . Smokeless tobacco: Never Used  Substance and Sexual Activity  . Alcohol use: No  . Drug use: No  . Sexual activity: Not on file  Other Topics Concern  . Not on file  Social History Narrative  . Not on file    Allergies  Allergen Reactions  . Sulfa Antibiotics Rash  . Sulfasalazine Rash    Current Outpatient Medications  Medication Sig Dispense Refill  . acetaminophen (TYLENOL) 325 MG tablet Take 650 mg by mouth every 6 (six) hours as needed (Pain).    Marland Kitchen amLODipine (NORVASC) 10 MG tablet Take 10 mg by mouth every morning.    Marland Kitchen aspirin 325 MG tablet Take 325 mg by mouth every 6 (six)  hours as needed (Pain).    Marland Kitchen atorvastatin (LIPITOR) 20 MG tablet Take 20 mg by mouth daily.    Marland Kitchen buPROPion (WELLBUTRIN XL) 300 MG 24 hr tablet Take 300 mg by mouth every morning.    . clonazePAM (KLONOPIN) 1 MG tablet Take 0.25-1 mg by mouth 3 (three) times daily as needed for anxiety. Pt will take 0.25mg  if needed throughout the day and will 1.5mg  at bedtime.    . divalproex (DEPAKOTE ER) 500 MG 24 hr tablet Take 500-1,000 mg by mouth 3 (three) times daily. 1 in the morning, 1 in the afternoon and 2 at bedtime.    Marland Kitchen doxazosin (CARDURA) 4 MG tablet Take 4 mg by mouth at bedtime.    Marland Kitchen doxycycline (VIBRA-TABS) 100 MG tablet Take 100 mg by mouth 2 (two) times daily.    Marland Kitchen gabapentin (NEURONTIN) 300 MG capsule Take 600 mg by mouth 2 (two) times  daily.    Marland Kitchen gemfibrozil (LOPID) 600 MG tablet Take 600 mg by mouth 2 (two) times daily.    . hydrocortisone 2.5 % cream Apply 1 application topically daily as needed (applied to face as needed for dry/itchy skin).    Marland Kitchen losartan-hydrochlorothiazide (HYZAAR) 100-12.5 MG tablet Take 1 tablet by mouth daily.    . metroNIDAZOLE (METROGEL) 0.75 % gel Apply 1 application topically daily as needed (Applied to face as needed).    . SUMAtriptan (IMITREX) 6 MG/0.5ML SOLN injection Inject 6 mg into the skin every 2 (two) hours as needed for migraine or headache. May repeat in 2 hours if headache persists or recurs.    Marland Kitchen tiZANidine (ZANAFLEX) 4 MG tablet Take 4 mg by mouth every 6 (six) hours as needed for muscle spasms.    Marland Kitchen lisinopril (PRINIVIL,ZESTRIL) 20 MG tablet Take 20 mg by mouth every morning.     No current facility-administered medications for this visit.     ROS:   General:  No weight loss, Fever, chills  HEENT: No recent headaches, no nasal bleeding, no visual changes, no sore throat  Neurologic: No dizziness, blackouts, seizures. No recent symptoms of stroke or mini- stroke. No recent episodes of slurred speech, or temporary blindness.  Cardiac: No recent episodes of chest pain/pressure, no shortness of breath at rest.  + shortness of breath with exertion.  Denies history of atrial fibrillation or irregular heartbeat  Vascular: No history of rest pain in feet.  No history of claudication.  No history of non-healing ulcer, No history of DVT   Pulmonary: No home oxygen, no productive cough, no hemoptysis,  No asthma or wheezing  Musculoskeletal:  [ ]  Arthritis, [ ]  Low back pain,  [ ]  Joint pain  Hematologic:No history of hypercoagulable state.  No history of easy bleeding.  No history of anemia  Gastrointestinal: No hematochezia or melena,  No gastroesophageal reflux, no trouble swallowing  Urinary: [ ]  chronic Kidney disease, [ ]  on HD - [ ]  MWF or [ ]  TTHS, [ ]  Burning with  urination, [ ]  Frequent urination, [ ]  Difficulty urinating;   Skin: No rashes  Psychological: No history of anxiety,  No history of depression   Physical Examination  Vitals:   06/30/17 0851  BP: 132/79  Pulse: 78  Resp: 18  Temp: (!) 97 F (36.1 C)  TempSrc: Oral  Weight: 192 lb (87.1 kg)  Height: 5\' 10"  (1.778 m)    Body mass index is 27.55 kg/m.  General:  Alert and oriented, no acute distress HEENT: Normal  Neck: No bruit or JVD Pulmonary: Clear to auscultation bilaterally Cardiac: Regular Rate and Rhythm without murmur Abdomen: Soft, non-tender, non-distended, no mass, no scars Skin: No rash Extremity Pulses:  2+ radial, brachial, femoral, popliteal, dorsalis pedis, posterior tibial pulses bilaterally Musculoskeletal: No deformity or edema  Neurologic: Upper and lower extremity motor 5/5 and symmetric  DATA:  I reviewed the images of the abdomen and pelvis CT scan dated March 09, 2017.  This shows a 3.5 cm infrarenal abdominal aortic aneurysm no involvement of the renal vessels.  The celiac and superior mesenteric arteries and renal arteries are all widely patent.  There is no significant iliac artery occlusive disease.  ASSESSMENT: Infrarenal abdominal aortic aneurysm 3.5 cm diameter.  Asymptomatic.   PLAN: Follow-up ultrasound abdominal aortic aneurysm in 1 year and see our nurse practitioner.  Patient was informed today of signs and symptoms of ruptured aneurysm including back and abdominal pain and to inform you the emergency room physician that he has a known history of abdominal aortic aneurysm if he had to go to the emergency room.  Pathophysiology of abdominal aortic aneurysms were discussed today as well as continued emphasis on blood pressure control and smoking cessation.  Discussed with the patient that if the aneurysm continues to grow over time we would consider repair at 5-5-1/2 cm in diameter.   Ruta Hinds, MD [facility]  [place] Office: [phone] Pager: [phone]

## 2017-06-30 NOTE — Addendum Note (Signed)
Addended by: Elam Dutch on: 06/30/2017 09:32 AM   Modules accepted: Level of Service

## 2017-07-01 ENCOUNTER — Encounter: Payer: Medicare Other | Admitting: Vascular Surgery

## 2018-03-05 ENCOUNTER — Other Ambulatory Visit (HOSPITAL_COMMUNITY): Payer: Self-pay | Admitting: *Deleted

## 2018-03-05 ENCOUNTER — Other Ambulatory Visit (HOSPITAL_COMMUNITY): Payer: Self-pay

## 2018-03-05 ENCOUNTER — Ambulatory Visit (HOSPITAL_COMMUNITY)
Admission: RE | Admit: 2018-03-05 | Discharge: 2018-03-05 | Disposition: A | Discharge status: Home / Self Care | Payer: Medicare HMO | Source: Ambulatory Visit | Attending: Emergency Medicine | Admitting: Emergency Medicine

## 2018-03-05 DIAGNOSIS — W3400XA Accidental discharge from unspecified firearms or gun, initial encounter: Secondary | ICD-10-CM

## 2018-03-05 DIAGNOSIS — T148XXA Other injury of unspecified body region, initial encounter: Secondary | ICD-10-CM | POA: Diagnosis present

## 2018-03-22 DEATH — deceased
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
